# Patient Record
Sex: Female | Born: 1990
Health system: Southern US, Community
[De-identification: ages and names within clinical notes are randomized; demographics above are authoritative.]

## PROBLEM LIST (undated history)

## (undated) DIAGNOSIS — J45909 Unspecified asthma, uncomplicated: Secondary | ICD-10-CM

## (undated) DIAGNOSIS — K219 Gastro-esophageal reflux disease without esophagitis: Secondary | ICD-10-CM

## (undated) DIAGNOSIS — T7840XA Allergy, unspecified, initial encounter: Secondary | ICD-10-CM

## (undated) DIAGNOSIS — F419 Anxiety disorder, unspecified: Secondary | ICD-10-CM

## (undated) DIAGNOSIS — Z9889 Other specified postprocedural states: Secondary | ICD-10-CM

## (undated) HISTORY — DX: Allergy, unspecified, initial encounter: T78.40XA

## (undated) HISTORY — DX: Gastro-esophageal reflux disease without esophagitis: K21.9

---

## 2016-10-07 HISTORY — PX: FOOT SURGERY: SHX648

## 2018-10-07 DIAGNOSIS — Z87442 Personal history of urinary calculi: Secondary | ICD-10-CM

## 2018-10-07 HISTORY — DX: Personal history of urinary calculi: Z87.442

## 2019-02-28 ENCOUNTER — Emergency Department (HOSPITAL_COMMUNITY): Payer: No Typology Code available for payment source

## 2019-02-28 ENCOUNTER — Other Ambulatory Visit: Payer: Self-pay

## 2019-02-28 ENCOUNTER — Emergency Department (HOSPITAL_COMMUNITY)
Admission: EM | Admit: 2019-02-28 | Discharge: 2019-02-28 | Disposition: A | Discharge status: Home / Self Care | Payer: No Typology Code available for payment source | Attending: Emergency Medicine | Admitting: Emergency Medicine

## 2019-02-28 ENCOUNTER — Encounter (HOSPITAL_COMMUNITY): Payer: Self-pay

## 2019-02-28 DIAGNOSIS — J45909 Unspecified asthma, uncomplicated: Secondary | ICD-10-CM | POA: Insufficient documentation

## 2019-02-28 DIAGNOSIS — R102 Pelvic and perineal pain: Secondary | ICD-10-CM | POA: Diagnosis present

## 2019-02-28 DIAGNOSIS — R52 Pain, unspecified: Secondary | ICD-10-CM

## 2019-02-28 DIAGNOSIS — Z79899 Other long term (current) drug therapy: Secondary | ICD-10-CM | POA: Insufficient documentation

## 2019-02-28 DIAGNOSIS — N201 Calculus of ureter: Secondary | ICD-10-CM | POA: Insufficient documentation

## 2019-02-28 HISTORY — DX: Unspecified asthma, uncomplicated: J45.909

## 2019-02-28 HISTORY — DX: Other specified postprocedural states: Z98.890

## 2019-02-28 HISTORY — DX: Anxiety disorder, unspecified: F41.9

## 2019-02-28 LAB — COMPREHENSIVE METABOLIC PANEL
ALT: 29 U/L (ref 0–44)
AST: 18 U/L (ref 15–41)
Albumin: 4.4 g/dL (ref 3.5–5.0)
Alkaline Phosphatase: 53 U/L (ref 38–126)
Anion gap: 6 (ref 5–15)
BUN: 11 mg/dL (ref 6–20)
CO2: 29 mmol/L (ref 22–32)
Calcium: 9.2 mg/dL (ref 8.9–10.3)
Chloride: 107 mmol/L (ref 98–111)
Creatinine, Ser: 0.8 mg/dL (ref 0.44–1.00)
GFR calc Af Amer: >60 mL/min (ref >60)
GFR calc non Af Amer: >60 mL/min (ref >60)
Glucose, Bld: 97 mg/dL (ref 70–99)
Potassium: 3.5 mmol/L (ref 3.5–5.1)
Sodium: 142 mmol/L (ref 135–145)
Total Bilirubin: 0.3 mg/dL (ref 0.3–1.2)
Total Protein: 7.5 g/dL (ref 6.5–8.1)

## 2019-02-28 LAB — URINALYSIS, ROUTINE W REFLEX MICROSCOPIC
Bilirubin Urine: NEGATIVE
Glucose, UA: NEGATIVE mg/dL
Ketones, ur: NEGATIVE mg/dL
Leukocytes,Ua: NEGATIVE
Nitrite: NEGATIVE
Protein, ur: NEGATIVE mg/dL
RBC / HPF: >50 RBC/hpf — ABNORMAL HIGH (ref 0–5)
Specific Gravity, Urine: 1.018 (ref 1.005–1.030)
pH: 9 — ABNORMAL HIGH (ref 5.0–8.0)

## 2019-02-28 LAB — CBC WITH DIFFERENTIAL/PLATELET
Abs Immature Granulocytes: 0.05 10*3/uL (ref 0.00–0.07)
Basophils Absolute: 0.1 10*3/uL (ref 0.0–0.1)
Basophils Relative: 1 %
Eosinophils Absolute: 0.2 10*3/uL (ref 0.0–0.5)
Eosinophils Relative: 1 %
HCT: 40.6 % (ref 36.0–46.0)
Hemoglobin: 13.2 g/dL (ref 12.0–15.0)
Immature Granulocytes: 0 %
Lymphocytes Relative: 22 %
Lymphs Abs: 3.4 10*3/uL (ref 0.7–4.0)
MCH: 29.6 pg (ref 26.0–34.0)
MCHC: 32.5 g/dL (ref 30.0–36.0)
MCV: 91 fL (ref 80.0–100.0)
Monocytes Absolute: 1.1 10*3/uL — ABNORMAL HIGH (ref 0.1–1.0)
Monocytes Relative: 7 %
Neutro Abs: 10.4 10*3/uL — ABNORMAL HIGH (ref 1.7–7.7)
Neutrophils Relative %: 69 %
Platelets: 412 10*3/uL — ABNORMAL HIGH (ref 150–400)
RBC: 4.46 MIL/uL (ref 3.87–5.11)
RDW: 11.9 % (ref 11.5–15.5)
WBC: 15.3 10*3/uL — ABNORMAL HIGH (ref 4.0–10.5)
nRBC: 0 % (ref 0.0–0.2)

## 2019-02-28 LAB — LIPASE, BLOOD: Lipase: 34 U/L (ref 11–51)

## 2019-02-28 LAB — POC URINE PREG, ED: Preg Test, Ur: NEGATIVE

## 2019-02-28 LAB — WET PREP, GENITAL
Clue Cells Wet Prep HPF POC: NONE SEEN
Sperm: NONE SEEN
Trich, Wet Prep: NONE SEEN
Yeast Wet Prep HPF POC: NONE SEEN

## 2019-02-28 MED ORDER — ONDANSETRON HCL 4 MG PO TABS
4.0000 mg | ORAL_TABLET | Freq: Once | ORAL | Status: AC
  Administered 2019-02-28: 4 mg via ORAL
  Filled 2019-02-28: qty 1

## 2019-02-28 MED ORDER — IOHEXOL 300 MG/ML  SOLN
100.0000 mL | Freq: Once | INTRAMUSCULAR | Status: AC | PRN
  Administered 2019-02-28: 100 mL via INTRAVENOUS

## 2019-02-28 MED ORDER — SODIUM CHLORIDE (PF) 0.9 % IJ SOLN
INTRAMUSCULAR | Status: AC
  Filled 2019-02-28: qty 50

## 2019-02-28 MED ORDER — ONDANSETRON HCL 4 MG PO TABS: 4.0000 mg | ORAL_TABLET | Freq: Three times a day (TID) | ORAL | 0 refills | Status: DC | PRN

## 2019-02-28 MED ORDER — TAMSULOSIN HCL 0.4 MG PO CAPS: 0.4000 mg | ORAL_CAPSULE | Freq: Every day | ORAL | 0 refills | Status: AC

## 2019-02-28 MED ORDER — MORPHINE SULFATE (PF) 4 MG/ML IV SOLN
4.0000 mg | Freq: Once | INTRAVENOUS | Status: AC
  Administered 2019-02-28: 4 mg via INTRAVENOUS
  Filled 2019-02-28: qty 1

## 2019-02-28 MED ORDER — SODIUM CHLORIDE 0.9 % IV BOLUS
1000.0000 mL | Freq: Once | INTRAVENOUS | Status: AC
  Administered 2019-02-28: 17:00:00 1000 mL via INTRAVENOUS

## 2019-02-28 MED ORDER — HYDROCODONE-ACETAMINOPHEN 5-325 MG PO TABS: 1.0000 | ORAL_TABLET | Freq: Four times a day (QID) | ORAL | 0 refills | Status: DC | PRN

## 2019-02-28 MED ORDER — ONDANSETRON HCL 4 MG/2ML IJ SOLN
4.0000 mg | Freq: Once | INTRAMUSCULAR | Status: AC
  Administered 2019-02-28: 17:00:00 4 mg via INTRAVENOUS
  Filled 2019-02-28: qty 2

## 2019-02-28 MED ORDER — MORPHINE SULFATE (PF) 4 MG/ML IV SOLN
4.0000 mg | Freq: Once | INTRAVENOUS | Status: AC
  Administered 2019-02-28: 18:00:00 4 mg via INTRAVENOUS
  Filled 2019-02-28: qty 1

## 2019-02-28 NOTE — ED Triage Notes (Signed)
Pt states since this am she started having sharp pain in her "crotch". Pt states that she has an IUD that was supposed to get removed in January and is concerned that is causing her to be unable to void. Pt states that the last time she was able to void was at 0815, and has been unable to since.

## 2019-02-28 NOTE — Discharge Instructions (Addendum)
Today you were diagnosed with a kidney stone on your CT scan.  You will be given a prescription for Flomax, pain medication, and nausea medication.  You should not drive, work, or operate machinery while taking the pain medication as it can make you very drowsy.  You will need to follow-up with urology for reevaluation and for further treatment of your kidney stone.   You have been tested for chlamydia and gonorrhea.  These results will be available in approximately 3 days and you will be contacted by the hospital if the results are positive. Avoid sexual contact until you are aware of the results, and please inform all sexual partners if you test positive for any of these diseases.  You will need to return to the emergency department for any fevers, persistent pain, persistent vomiting, inability to urinate, or any new or worsening symptoms.

## 2019-02-28 NOTE — ED Provider Notes (Addendum)
[facility]-EMERGENCY DEPT Provider Note   CSN: [id number] Arrival date & time: 02/28/19  1606    History   Chief Complaint Chief Complaint  Patient presents with   Pelvic Pain    HPI Sara Mccormick is a 28 y.o. female.     HPI   Pt is a 28 y/o female with a h/o anxiety, asthma, who presents to the ED today c/o right lower abd pain/pelvic pain that began this morning. Pain is constant and severe in nature. Rates it "20"/10. States pain radiates through to her back.  Reports possible hematuria this morning. No dysuria. States she feels like she has had some urinary retention today.  Reports nausea, vomiting as well. Denies fevers, diarrhea, constipation.  Denies vaginal bleeding or vaginal discharge. She is currently sexually active and states she uses protection every time.   Past Medical History:  Diagnosis Date   Anxiety disorder    Asthma    H/O colonoscopy     There are no active problems to display for this patient.   Past Surgical History:  Procedure Laterality Date   FOOT SURGERY Right      OB History   No obstetric history on file.      Home Medications    Prior to Admission medications   Medication Sig Start Date End Date Taking? Authorizing Provider  Ascorbic Acid (VITAMIN C PO) Take 1 tablet by mouth daily.   Yes [provider]  Multiple Vitamins-Minerals (MULTIVITAMIN GUMMIES ADULT PO) Take 1 each by mouth daily.   Yes [provider]  VITAMIN D PO Take 1 tablet by mouth daily.   Yes [provider]  VITAMIN E PO Take 1 tablet by mouth daily.   Yes [provider]  HYDROcodone-acetaminophen (NORCO/VICODIN) 5-325 MG tablet Take 1 tablet by mouth every 6 (six) hours as needed. 02/28/19   Jeremih Dearmas S, PA-C  ondansetron (ZOFRAN) 4 MG tablet Take 1 tablet (4 mg total) by mouth every 8 (eight) hours as needed for nausea or vomiting. 02/28/19   Vernee Baines S, PA-C  tamsulosin  (FLOMAX) 0.4 MG CAPS capsule Take 1 capsule (0.4 mg total) by mouth daily for 30 days. 02/28/19 03/30/19  Loyalty Arentz S, PA-C    Family History No family history on file.  Social History Social History   Tobacco Use   Smoking status: Never Smoker   Smokeless tobacco: Never Used  Substance Use Topics   Alcohol use: Never    Frequency: Never   Drug use: Never     Allergies   Norco [hydrocodone-acetaminophen]   Review of Systems Review of Systems  Constitutional: Negative for fever.  HENT: Negative for sore throat.   Eyes: Negative for visual disturbance.  Respiratory: Negative for cough and shortness of breath.   Cardiovascular: Negative for chest pain.  Gastrointestinal: Positive for abdominal pain, nausea and vomiting. Negative for constipation and diarrhea.  Genitourinary: Positive for difficulty urinating, hematuria (?) and pelvic pain. Negative for dysuria, vaginal bleeding and vaginal discharge.  Musculoskeletal: Negative for arthralgias and back pain.  Skin: Negative for color change and rash.  Neurological: Negative for headaches.  All other systems reviewed and are negative.    Physical Exam Updated Vital Signs BP 139/80 (BP Location: Right Arm)    Pulse 84    Temp 98.3 F (36.8 C) (Oral)    Resp 18    Ht  (1.778 m)    Wt 93 kg    SpO2  96%    BMI 29.41 kg/m   Physical Exam Vitals signs and nursing note reviewed.  Constitutional:      General: She is in acute distress.     Appearance: She is well-developed.  HENT:     Head: Normocephalic and atraumatic.  Eyes:     Conjunctiva/sclera: Conjunctivae normal.  Neck:     Musculoskeletal: Neck supple.  Cardiovascular:     Rate and Rhythm: Regular rhythm. Tachycardia present.     Heart sounds: Normal heart sounds. No murmur.  Pulmonary:     Effort: Pulmonary effort is normal. No respiratory distress.     Breath sounds: Normal breath sounds. No wheezing, rhonchi or rales.  Abdominal:      General: Bowel sounds are normal. There is no distension.     Palpations: Abdomen is soft.     Tenderness: There is abdominal tenderness (rlq/right suprapubic area). There is right CVA tenderness and guarding. There is no left CVA tenderness.  Genitourinary:    Comments: Exam performed by Karrie Meres,  exam chaperoned Date: 02/28/2019 Pelvic exam: normal external genitalia without evidence of trauma. VULVA: normal appearing vulva with no masses, tenderness or lesion. VAGINA: normal appearing vagina with normal color and discharge, no lesions. CERVIX: small amount of erythema to cervix, IUD strings visualized, cervical motion tenderness absent, cervical os closed, thick clear discharge mixed with yellow discharge present;  Wet prep and DNA probe for chlamydia and GC obtained.   ADNEXA: normal adnexa in size, right adnexal ttp present, no left adnexal ttp UTERUS: uterus is normal size, shape, consistency and nontender.  Skin:    General: Skin is warm and dry.  Neurological:     Mental Status: She is alert.      ED Treatments / Results  Labs (all labs ordered are listed, but only abnormal results are displayed) Labs Reviewed  WET PREP, GENITAL - Abnormal; Notable for the following components:      Result Value   WBC, Wet Prep HPF POC FEW (*)    All other components within normal limits  URINALYSIS, ROUTINE W REFLEX MICROSCOPIC - Abnormal; Notable for the following components:   APPearance TURBID (*)    pH 9.0 (*)    Hgb urine dipstick LARGE (*)    RBC / HPF >50 (*)    Bacteria, UA RARE (*)    All other components within normal limits  CBC WITH DIFFERENTIAL/PLATELET - Abnormal; Notable for the following components:   WBC 15.3 (*)    Platelets 412 (*)    Neutro Abs 10.4 (*)    Monocytes Absolute 1.1 (*)    All other components within normal limits  COMPREHENSIVE METABOLIC PANEL  LIPASE, BLOOD  POC URINE PREG, ED  GC/CHLAMYDIA PROBE AMP ([place]) NOT AT [facility]     EKG None  Radiology US Pelvis Complete  Result Date: 02/28/2019 CLINICAL DATA:  Right lower quadrant pain EXAM: TRANSABDOMINAL AND TRANSVAGINAL ULTRASOUND OF PELVIS DOPPLER ULTRASOUND OF OVARIES TECHNIQUE: Both transabdominal and transvaginal ultrasound examinations of the pelvis were performed. Transabdominal technique was performed for global imaging of the pelvis including uterus, ovaries, adnexal regions, and pelvic cul-de-sac. It was necessary to proceed with endovaginal exam following the transabdominal exam to visualize the endometrium and ovaries. Color and duplex Doppler ultrasound was utilized to evaluate blood flow to the ovaries. COMPARISON:  None. FINDINGS: Uterus Measurements: 7.9 x 3.7 x 4.5 cm = volume: 68 mL. Intrauterine device in satisfactory position. No fibroids or other mass visualized.  Endometrium Thickness: 3 mm. No focal abnormality visualized. Small amount of fluid in the endometrial canal. Right ovary Measurements: 3 x 2 x 1.6 cm = volume: 4.9 mL. Normal appearance/no adnexal mass. Left ovary Measurements: 2 x 1.5 x 1.4 cm = volume: 2.2 mL. Normal appearance/no adnexal mass. Pulsed Doppler evaluation of both ovaries demonstrates normal low-resistance arterial and venous waveforms. Other findings No abnormal free fluid. IMPRESSION: 1. Normal pelvic ultrasound. 2. Intrauterine device in satisfactory position. 3. No ovarian torsion. Electronically Signed   By: Elige Ko   On: 02/28/2019 19:44   Ct Abdomen Pelvis W Contrast  Result Date: 02/28/2019 CLINICAL DATA:  Right lower quadrant and suprapubic pain starting this morning. EXAM: CT ABDOMEN AND PELVIS WITH CONTRAST TECHNIQUE: Multidetector CT imaging of the abdomen and pelvis was performed using the standard protocol following bolus administration of intravenous contrast. CONTRAST:  OMNIPAQUE IOHEXOL 300 MG/ML  SOLN COMPARISON:  None. FINDINGS: Lower chest: Hepatobiliary: No focal liver abnormality is seen. No  gallstones, gallbladder wall thickening, or biliary dilatation. Pancreas: Unremarkable. No pancreatic ductal dilatation or surrounding inflammatory changes. Spleen: Normal in size without focal abnormality. Adrenals/Urinary Tract: The left kidney and left adrenal gland are unremarkable. There is fat stranding about the right kidney. There is mild right-sided hydroureteronephrosis secondary to an obstructing 1-2 mm stone at the right UVJ. No additional radiopaque stones identified in the right kidney. The bladder is unremarkable. Stomach/Bowel: Stomach is within normal limits. Appendix appears normal. No evidence of bowel wall thickening, distention, or inflammatory changes. Vascular/Lymphatic: No significant vascular findings are present. No enlarged abdominal or pelvic lymph nodes. Reproductive: An IUD is in place. The ovaries are unremarkable. There is trace amount of free fluid in the pelvis, likely physiologic. Other: No abdominal wall hernia or abnormality. No abdominopelvic ascites. Musculoskeletal: No acute or significant osseous findings. IMPRESSION: 1. Mild right-sided hydroureteronephrosis secondary to an obstructing 1-2 mm stone at the right UVJ. No additional radiopaque stones identified in either kidney. 2. Normal appendix in the right lower quadrant. 3. IUD in place that appears grossly well positioned. 4. Trace amount of free fluid in the pelvis, likely reactive or physiologic. Electronically Signed   By: Katherine Mantle M.D.   On: 02/28/2019 20:40   [place] Art/ven Flow Abd Pelv Doppler  Result Date: 02/28/2019 CLINICAL DATA:  Right lower quadrant pain EXAM: TRANSABDOMINAL AND TRANSVAGINAL ULTRASOUND OF PELVIS DOPPLER ULTRASOUND OF OVARIES TECHNIQUE: Both transabdominal and transvaginal ultrasound examinations of the pelvis were performed. Transabdominal technique was performed for global imaging of the pelvis including uterus, ovaries, adnexal regions, and pelvic cul-de-sac. It was necessary to  proceed with endovaginal exam following the transabdominal exam to visualize the endometrium and ovaries. Color and duplex Doppler ultrasound was utilized to evaluate blood flow to the ovaries. COMPARISON:  None. FINDINGS: Uterus Measurements: 7.9 x 3.7 x 4.5 cm = volume: 68 mL. Intrauterine device in satisfactory position. No fibroids or other mass visualized. Endometrium Thickness: 3 mm. No focal abnormality visualized. Small amount of fluid in the endometrial canal. Right ovary Measurements: 3 x 2 x 1.6 cm = volume: 4.9 mL. Normal appearance/no adnexal mass. Left ovary Measurements: 2 x 1.5 x 1.4 cm = volume: 2.2 mL. Normal appearance/no adnexal mass. Pulsed Doppler evaluation of both ovaries demonstrates normal low-resistance arterial and venous waveforms. Other findings No abnormal free fluid. IMPRESSION: 1. Normal pelvic ultrasound. 2. Intrauterine device in satisfactory position. 3. No ovarian torsion. Electronically Signed   By: Elige Ko   On: 02/28/2019  19:44   [place] Pelvic Complete W Transvaginal And Torsion R/o  Result Date: 02/28/2019 CLINICAL DATA:  Right lower quadrant pain EXAM: TRANSABDOMINAL AND TRANSVAGINAL ULTRASOUND OF PELVIS DOPPLER ULTRASOUND OF OVARIES TECHNIQUE: Both transabdominal and transvaginal ultrasound examinations of the pelvis were performed. Transabdominal technique was performed for global imaging of the pelvis including uterus, ovaries, adnexal regions, and pelvic cul-de-sac. It was necessary to proceed with endovaginal exam following the transabdominal exam to visualize the endometrium and ovaries. Color and duplex Doppler ultrasound was utilized to evaluate blood flow to the ovaries. COMPARISON:  None. FINDINGS: Uterus Measurements: 7.9 x 3.7 x 4.5 cm = volume: 68 mL. Intrauterine device in satisfactory position. No fibroids or other mass visualized. Endometrium Thickness: 3 mm. No focal abnormality visualized. Small amount of fluid in the endometrial canal. Right ovary  Measurements: 3 x 2 x 1.6 cm = volume: 4.9 mL. Normal appearance/no adnexal mass. Left ovary Measurements: 2 x 1.5 x 1.4 cm = volume: 2.2 mL. Normal appearance/no adnexal mass. Pulsed Doppler evaluation of both ovaries demonstrates normal low-resistance arterial and venous waveforms. Other findings No abnormal free fluid. IMPRESSION: 1. Normal pelvic ultrasound. 2. Intrauterine device in satisfactory position. 3. No ovarian torsion. Electronically Signed   By: Elige KoHetal  Patel   On: 02/28/2019 19:44    Procedures Procedures (including critical care time)  Medications Ordered in ED Medications  sodium chloride (PF) 0.9 % injection (has no administration in time range)  ondansetron (ZOFRAN) tablet 4 mg (4 mg Oral Given 02/28/19 1636)  ondansetron (ZOFRAN) injection 4 mg (4 mg Intravenous Given 02/28/19 1707)  sodium chloride 0.9 % bolus 1,000 mL (0 mLs Intravenous Stopped 02/28/19 1757)  morphine 4 MG/ML injection 4 mg (4 mg Intravenous Given 02/28/19 1707)  morphine 4 MG/ML injection 4 mg (4 mg Intravenous Given 02/28/19 1757)  iohexol (OMNIPAQUE) 300 MG/ML solution 100 mL (100 mLs Intravenous Contrast Given 02/28/19 2024)     Initial Impression / Assessment and Plan / ED Course  I have reviewed the triage vital signs and the nursing notes.  Pertinent labs & imaging results that were available during my care of the patient were reviewed by me and considered in my medical decision making (see chart for details).     Final Clinical Impressions(s) / ED Diagnoses   Final diagnoses:  Ureteral stone   Pt with rlq/right suprapubic abd pain starting this AM. Associated with NV. No fevers. Mildly tachycardic. Hypertensive. Afebrile.  On exam has RLQ TTP and right suprapubic ttp with guarding. Also with right CVA TTP. Pelvic exam with right adnexal TTP. No CMT. Mildly erythematous cervix, IUD strings visualized.   CBC with leukocytosis of 15. No anemia. Thrombocytosis present which could be reactive.   CMP WNL Lipase WNL UA with hematuria, and >50 RBCs. Rare bacteria. No leukocytes or nitrites. 0-5 WBCs.  U preg negative  Pelvic US ordered due to concern for ovarian torsion which showed no evidence of ovarian torsion or other abnormality.  CT abd/pelvis added to assess for possible appendicitis, kidney stone, or other abnormality which showed: 1. Mild right-sided hydroureteronephrosis secondary to an obstructing 1-2 mm stone at the right UVJ. No additional radiopaque stones identified in either kidney. 2. Normal appendix in the right lower quadrant. 3. IUD in place that appears grossly well positioned. 4. Trace amount of free fluid in the pelvis, likely reactive or Physiologic.  On reassessment after pain medicines and nausea medicines patient symptoms feel improved.  She has been able to tolerate p.o.  I discussed the findings of ureteral stone on her CT scan.  Discussed the remainder of the findings.  Advised patient will need to follow-up with urology.  She will be given Rx for pain medication, nausea medicine and Flomax.  [facility] narcotic database reviewed and there are no red flags.  Advised return to the ER for new or worsening symptoms in the meantime.  She voiced understanding of the plan agrees to return.  Questions answered.  Patient stable at discharge.    ED Discharge Orders         Ordered    tamsulosin (FLOMAX) 0.4 MG CAPS capsule  Daily     02/28/19 2050    HYDROcodone-acetaminophen (NORCO/VICODIN) 5-325 MG tablet  Every 6 hours PRN     02/28/19 2050    ondansetron (ZOFRAN) 4 MG tablet  Every 8 hours PRN     02/28/19 2050           Ruqayya Ventress S, PA-C 02/28/19 2052    Nakyah Erdmann S, PA-C 02/28/19 2054    Shaune Pollack, MD 03/01/19 1031

## 2019-02-28 NOTE — ED Notes (Signed)
Attempted to bladder scan pt. Bladder scanner machine was not working.

## 2019-03-02 LAB — GC/CHLAMYDIA PROBE AMP (~~LOC~~) NOT AT ARMC
Chlamydia: NEGATIVE
Neisseria Gonorrhea: NEGATIVE

## 2019-08-04 ENCOUNTER — Other Ambulatory Visit: Payer: Self-pay

## 2019-08-04 DIAGNOSIS — Z20822 Contact with and (suspected) exposure to covid-19: Secondary | ICD-10-CM

## 2019-08-05 LAB — NOVEL CORONAVIRUS, NAA: SARS-CoV-2, NAA: DETECTED — AB

## 2020-02-09 LAB — HM PAP SMEAR: HM Pap smear: NEGATIVE

## 2020-04-20 ENCOUNTER — Ambulatory Visit (INDEPENDENT_AMBULATORY_CARE_PROVIDER_SITE_OTHER)
Admission: RE | Admit: 2020-04-20 | Discharge: 2020-04-20 | Disposition: A | Discharge status: Home / Self Care | Payer: No Typology Code available for payment source | Source: Ambulatory Visit | Attending: Physician Assistant | Admitting: Physician Assistant

## 2020-04-20 ENCOUNTER — Encounter: Payer: Self-pay | Admitting: Physician Assistant

## 2020-04-20 ENCOUNTER — Ambulatory Visit (INDEPENDENT_AMBULATORY_CARE_PROVIDER_SITE_OTHER): Payer: No Typology Code available for payment source | Admitting: Physician Assistant

## 2020-04-20 ENCOUNTER — Other Ambulatory Visit: Payer: Self-pay

## 2020-04-20 VITALS — BP 110/80 | HR 107 | Temp 98.5°F | Resp 16 | Ht 70.0 in | Wt 219.0 lb

## 2020-04-20 DIAGNOSIS — M40202 Unspecified kyphosis, cervical region: Secondary | ICD-10-CM

## 2020-04-20 DIAGNOSIS — G8929 Other chronic pain: Secondary | ICD-10-CM | POA: Diagnosis not present

## 2020-04-20 DIAGNOSIS — M5441 Lumbago with sciatica, right side: Secondary | ICD-10-CM

## 2020-04-20 MED ORDER — MELOXICAM 15 MG PO TABS: 15.0000 mg | ORAL_TABLET | Freq: Every day | ORAL | 0 refills | Status: DC

## 2020-04-20 NOTE — Patient Instructions (Signed)
Please go to our [place] office for x-ray. You can do this M-F 8-5.  The address is [address] [place], [place]. (Across from [facility]) The ladies can give you directions if needed.  Avoid heavy lifting or overexertion. Apply heating pad to the lower back for 10-15 minutes a few times per day. Take the meloxicam as directed with food. Restart the muscle relaxant in the evening. Start the stretches below. Let me know if things are not resolving.  When you are able, let's get you in for a checkup with fasting labs. This can be done at your earliest convenience.   It was very nice meeting you today. Welcome to [facility]!   Sciatica Rehab Ask your health care provider which exercises are safe for you. Do exercises exactly as told by your health care provider and adjust them as directed. It is normal to feel mild stretching, pulling, tightness, or discomfort as you do these exercises. Stop right away if you feel sudden pain or your pain gets worse. Do not begin these exercises until told by your health care provider. Stretching and range-of-motion exercises These exercises warm up your muscles and joints and improve the movement and flexibility of your hips and back. These exercises also help to relieve pain, numbness, and tingling. Sciatic nerve glide 1. Sit in a chair with your head facing down toward your chest. Place your hands behind your back. Let your shoulders slump forward. 2. Slowly straighten one of your legs while you tilt your head back as if you are looking toward the ceiling. Only straighten your leg as far as you can without making your symptoms worse. 3. Hold this position for __________ seconds. 4. Slowly return to the starting position. 5. Repeat with your other leg. Repeat __________ times. Complete this exercise __________ times a day. Knee to chest with hip adduction and internal rotation  1. Lie on your back on a firm surface with both legs  straight. 2. Bend one of your knees and move it up toward your chest until you feel a gentle stretch in your lower back and buttock. Then, move your knee toward the shoulder that is on the opposite side from your leg. This is hip adduction and internal rotation. ? Hold your leg in this position by holding on to the front of your knee. 3. Hold this position for __________ seconds. 4. Slowly return to the starting position. 5. Repeat with your other leg. Repeat __________ times. Complete this exercise __________ times a day. Prone extension on elbows  1. Lie on your abdomen on a firm surface. A bed may be too soft for this exercise. 2. Prop yourself up on your elbows. 3. Use your arms to help lift your chest up until you feel a gentle stretch in your abdomen and your lower back. ? This will place some of your body weight on your elbows. If this is uncomfortable, try stacking pillows under your chest. ? Your hips should stay down, against the surface that you are lying on. Keep your hip and back muscles relaxed. 4. Hold this position for __________ seconds. 5. Slowly relax your upper body and return to the starting position. Repeat __________ times. Complete this exercise __________ times a day. Strengthening exercises These exercises build strength and endurance in your back. Endurance is the ability to use your muscles for a long time, even after they get tired. Pelvic tilt This exercise strengthens the muscles that lie deep in the abdomen. 1.  Lie on your back on a firm surface. Bend your knees and keep your feet flat on the floor. 2. Tense your abdominal muscles. Tip your pelvis up toward the ceiling and flatten your lower back into the floor. ? To help with this exercise, you may place a small towel under your lower back and try to push your back into the towel. 3. Hold this position for __________ seconds. 4. Let your muscles relax completely before you repeat this exercise. Repeat  __________ times. Complete this exercise __________ times a day. Alternating arm and leg raises  1. Get on your hands and knees on a firm surface. If you are on a hard floor, you may want to use padding, such as an exercise mat, to cushion your knees. 2. Line up your arms and legs. Your hands should be directly below your shoulders, and your knees should be directly below your hips. 3. Lift your left leg behind you. At the same time, raise your right arm and straighten it in front of you. ? Do not lift your leg higher than your hip. ? Do not lift your arm higher than your shoulder. ? Keep your abdominal and back muscles tight. ? Keep your hips facing the ground. ? Do not arch your back. ? Keep your balance carefully, and do not hold your breath. 4. Hold this position for __________ seconds. 5. Slowly return to the starting position. 6. Repeat with your right leg and your left arm. Repeat __________ times. Complete this exercise __________ times a day. Posture and body mechanics Good posture and healthy body mechanics can help to relieve stress in your body's tissues and joints. Body mechanics refers to the movements and positions of your body while you do your daily activities. Posture is part of body mechanics. Good posture means:  Your spine is in its natural S-curve position (neutral).  Your shoulders are pulled back slightly.  Your head is not tipped forward. Follow these guidelines to improve your posture and body mechanics in your everyday activities. Standing   When standing, keep your spine neutral and your feet about hip width apart. Keep a slight bend in your knees. Your ears, shoulders, and hips should line up.  When you do a task in which you stand in one place for a long time, place one foot up on a stable object that is 2-4 inches (5-10 cm) high, such as a footstool. This helps keep your spine neutral. Sitting   When sitting, keep your spine neutral and keep your feet  flat on the floor. Use a footrest, if necessary, and keep your thighs parallel to the floor. Avoid rounding your shoulders, and avoid tilting your head forward.  When working at a desk or a computer, keep your desk at a height where your hands are slightly lower than your elbows. Slide your chair under your desk so you are close enough to maintain good posture.  When working at a computer, place your monitor at a height where you are looking straight ahead and you do not have to tilt your head forward or downward to look at the screen. Resting  When lying down and resting, avoid positions that are most painful for you.  If you have pain with activities such as sitting, bending, stooping, or squatting, lie in a position in which your body does not bend very much. For example, avoid curling up on your side with your arms and knees near your chest (fetal position).  If you  have pain with activities such as standing for a long time or reaching with your arms, lie with your spine in a neutral position and bend your knees slightly. Try the following positions: ? Lying on your side with a pillow between your knees. ? Lying on your back with a pillow under your knees. Lifting   When lifting objects, keep your feet at least shoulder width apart and tighten your abdominal muscles.  Bend your knees and hips and keep your spine neutral. It is important to lift using the strength of your legs, not your back. Do not lock your knees straight out.  Always ask for help to lift heavy or awkward objects. This information is not intended to replace advice given to you by your health care provider. Make sure you discuss any questions you have with your health care provider. Document Revised: 01/15/2019 Document Reviewed: 10/15/2018 Elsevier Patient Education  2020 [employer].

## 2020-04-20 NOTE — Progress Notes (Signed)
Patient presents to clinic today to establish care. Moved to the area from [place] just before the COVID pandemic started. Has been able to adjust well to the area.  Patient with prior history of asthmatic bronchitis, requiring periodic use of a rescue inhaler. Notes since moving to [place] she has not had any issue with her breathing. Denies history of nighttime symptoms or exacerbations requiring steroids..  Patient endorses about 1 month of lower back pain of unknown trigger. Was evaluated at an UC 2-3 weeks ago at which time she was told she had sciatica, and given pain medication a prednisone taper and an in-office injection.  Notes improvement in her symptoms but over the past week, they are back to baseline. Denies any known trauma or injury to the area prior to symptom onset. Denies numbness, tingling or weakness of extremities. Denies saddle anesthesia or change to bowel/bladder habits.   Past Medical History:  Diagnosis Date  . Anxiety disorder   . Asthma   . H/O colonoscopy     Current Outpatient Medications on File Prior to Visit  Medication Sig Dispense Refill  . Ascorbic Acid (VITAMIN C PO) Take 1 tablet by mouth daily.    Marland Kitchen HYDROcodone-acetaminophen (NORCO/VICODIN) 5-325 MG tablet Take 1 tablet by mouth every 6 (six) hours as needed. 8 tablet 0  . Multiple Vitamins-Minerals (MULTIVITAMIN GUMMIES ADULT PO) Take 1 each by mouth daily.    . ondansetron (ZOFRAN) 4 MG tablet Take 1 tablet (4 mg total) by mouth every 8 (eight) hours as needed for nausea or vomiting. 10 tablet 0  . VITAMIN D PO Take 1 tablet by mouth daily.    Marland Kitchen VITAMIN E PO Take 1 tablet by mouth daily.     No current facility-administered medications on file prior to visit.    Allergies  Allergen Reactions  . Norco [Hydrocodone-Acetaminophen] Nausea And Vomiting    No family history on file.  Social History   Socioeconomic History  . Marital status: Married    Spouse name: Not on file  . Number of  children: Not on file  . Years of education: Not on file  . Highest education level: Not on file  Occupational History  . Not on file  Tobacco Use  . Smoking status: Never Smoker  . Smokeless tobacco: Never Used  Substance and Sexual Activity  . Alcohol use: Never  . Drug use: Never  . Sexual activity: Not on file  Other Topics Concern  . Not on file  Social History Narrative  . Not on file   Social Determinants of Health   Financial Resource Strain:   . Difficulty of Paying Living Expenses:   Food Insecurity:   . Worried About Programme researcher, broadcasting/film/video in the Last Year:   . Barista in the Last Year:   Transportation Needs:   . Freight forwarder (Medical):   Marland Kitchen Lack of Transportation (Non-Medical):   Physical Activity:   . Days of Exercise per Week:   . Minutes of Exercise per Session:   Stress:   . Feeling of Stress :   Social Connections:   . Frequency of Communication with Friends and Family:   . Frequency of Social Gatherings with Friends and Family:   . Attends Religious Services:   . Active Member of Clubs or Organizations:   . Attends Banker Meetings:   Marland Kitchen Marital Status:    Review of Systems - See HPI.  All other ROS are  negative.  Ht 5\' 10"  (1.778 m)   Wt 219 lb (99.3 kg)   BMI 31.42 kg/m   Physical Exam Vitals reviewed.  Constitutional:      Appearance: Normal appearance.  HENT:     Head: Normocephalic and atraumatic.  Eyes:     Conjunctiva/sclera: Conjunctivae normal.  Cardiovascular:     Rate and Rhythm: Normal rate and regular rhythm.     Pulses: Normal pulses.     Heart sounds: Normal heart sounds.  Pulmonary:     Effort: Pulmonary effort is normal.     Breath sounds: Normal breath sounds.  Musculoskeletal:     Cervical back: Neck supple.     Thoracic back: Normal.     Lumbar back: Spasms and tenderness present. No bony tenderness. Normal range of motion.     Comments: Kyphosis of cervical spine noted on examination    Neurological:     General: No focal deficit present.     Mental Status: She is alert and oriented to person, place, and time.  Psychiatric:        Mood and Affect: Mood normal.      Assessment/Plan: 1. Chronic right-sided low back pain with right-sided sciatica Giving chronicity would check x-ray today to assess joints and disc space heights. Will have her continue muscle relaxant. Rx meloxicam 15 mg once daily.  Stretching exercises reviewed with patient. If not improving would proceed with physical therapy. May have to consider further imaging with MRI.  - DG Lumbar Spine Complete; Future  2. Kyphosis of cervical region, unspecified kyphosis type Patient would like x-ray to further assess. May need spine specialist in the future. No chronic neck pain at present thankfully. - DG Cervical Spine Complete; Future  This visit occurred during the SARS-CoV-2 public health emergency.  Safety protocols were in place, including screening questions prior to the visit, additional usage of staff PPE, and extensive cleaning of exam room while observing appropriate contact time as indicated for disinfecting solutions.      , PA-C

## 2020-04-28 ENCOUNTER — Encounter: Payer: 59 | Admitting: Physician Assistant

## 2020-05-11 ENCOUNTER — Encounter: Payer: Self-pay | Admitting: Physician Assistant

## 2020-05-11 ENCOUNTER — Ambulatory Visit (INDEPENDENT_AMBULATORY_CARE_PROVIDER_SITE_OTHER): Payer: No Typology Code available for payment source | Admitting: Physician Assistant

## 2020-05-11 ENCOUNTER — Other Ambulatory Visit: Payer: Self-pay

## 2020-05-11 VITALS — BP 110/80 | HR 101 | Temp 98.3°F | Resp 16 | Ht 70.0 in | Wt 220.0 lb

## 2020-05-11 DIAGNOSIS — Z1159 Encounter for screening for other viral diseases: Secondary | ICD-10-CM | POA: Diagnosis not present

## 2020-05-11 DIAGNOSIS — Z0000 Encounter for general adult medical examination without abnormal findings: Secondary | ICD-10-CM | POA: Diagnosis not present

## 2020-05-11 MED ORDER — FLUOCINOLONE ACETONIDE 0.01 % EX SOLN: Freq: Two times a day (BID) | CUTANEOUS | 0 refills | Status: AC

## 2020-05-11 NOTE — Progress Notes (Signed)
Patient presents to clinic today for annual exam.  Patient is fasting for labs. Patient has recently found out that she was 5-weeks pregnant. Has upcoming [place] scheduled.   Health Maintenance: Immunizations -- UTD. TDaP with OB/GYN  Past Medical History:  Diagnosis Date   Allergy    Anxiety disorder    Asthma    GERD (gastroesophageal reflux disease)    H/O colonoscopy    History of kidney stones 2020    Past Surgical History:  Procedure Laterality Date   FOOT SURGERY Right 2018    Current Outpatient Medications on File Prior to Visit  Medication Sig Dispense Refill   Prenatal Vit-DSS-Fe Fum-FA (PRENATAL 19) tablet Take 1 tablet by mouth daily.     No current facility-administered medications on file prior to visit.    Allergies  Allergen Reactions   Norco [Hydrocodone-Acetaminophen] Nausea And Vomiting    Family History  Problem Relation Age of Onset   Cancer Mother    Diabetes Mother    Hypertension Mother    Miscarriages / [place] Mother    Asperger's syndrome Father    Asperger's syndrome Brother    Scoliosis Maternal Grandmother    Alcohol abuse Maternal Grandfather    Kidney disease Maternal Grandfather    Cancer Paternal Grandmother    Heart attack Paternal Grandmother    Hypertension Paternal Grandmother    Cancer Paternal Grandfather    Hearing loss Paternal Grandfather    Asthma Sister    Depression Sister    Scoliosis Sister     Social History   Socioeconomic History   Marital status: Married    Spouse name: Not on file   Number of children: Not on file   Years of education: Not on file   Highest education level: Not on file  Occupational History   Not on file  Tobacco Use   Smoking status: Never Smoker   Smokeless tobacco: Never Used  Building services engineer Use: Never used  Substance and Sexual Activity   Alcohol use: Never   Drug use: Never   Sexual activity: Yes    Birth control/protection:  None  Other Topics Concern   Not on file  Social History Narrative   Not on file   Social Determinants of Health   Financial Resource Strain:    Difficulty of Paying Living Expenses:   Food Insecurity:    Worried About Programme researcher, broadcasting/film/video in the Last Year:    Barista in the Last Year:   Transportation Needs:    Freight forwarder (Medical):    Lack of Transportation (Non-Medical):   Physical Activity:    Days of Exercise per Week:    Minutes of Exercise per Session:   Stress:    Feeling of Stress :   Social Connections:    Frequency of Communication with Friends and Family:    Frequency of Social Gatherings with Friends and Family:    Attends Religious Services:    Active Member of Clubs or Organizations:    Attends Engineer, structural:    Marital Status:   Intimate Partner Violence:    Fear of Current or Ex-Partner:    Emotionally Abused:    Physically Abused:    Sexually Abused:    Review of Systems  Constitutional: Negative for fever and weight loss.  HENT: Negative for ear discharge, ear pain, hearing loss and tinnitus.   Eyes: Negative for blurred vision, double vision, photophobia and  pain.  Respiratory: Negative for cough and shortness of breath.   Cardiovascular: Negative for chest pain and palpitations.  Gastrointestinal: Negative for abdominal pain, blood in stool, constipation, diarrhea, heartburn, melena, nausea and vomiting.  Genitourinary: Negative for dysuria, flank pain, frequency, hematuria and urgency.  Musculoskeletal: Negative for falls.  Neurological: Negative for dizziness, loss of consciousness and headaches.  Endo/Heme/Allergies: Negative for environmental allergies.  Psychiatric/Behavioral: Negative for depression, hallucinations, substance abuse and suicidal ideas. The patient is not nervous/anxious and does not have insomnia.    BP 110/80    Pulse (!) 101    Temp 98.3 F (36.8 C) (Temporal)    Resp 16     Ht 5\' 10"  (1.778 m)    Wt 220 lb (99.8 kg)    LMP 04/05/2020    SpO2 99%    BMI 31.57 kg/m   Physical Exam Vitals reviewed.  HENT:     Head: Normocephalic and atraumatic.     Right Ear: Tympanic membrane, ear canal and external ear normal.     Left Ear: Tympanic membrane, ear canal and external ear normal.     Nose: Nose normal. No mucosal edema.     Mouth/Throat:     Pharynx: Uvula midline. No oropharyngeal exudate or posterior oropharyngeal erythema.  Eyes:     Conjunctiva/sclera: Conjunctivae normal.     Pupils: Pupils are equal, round, and reactive to light.  Neck:     Thyroid: No thyromegaly.  Cardiovascular:     Rate and Rhythm: Normal rate and regular rhythm.     Heart sounds: Normal heart sounds.  Pulmonary:     Effort: Pulmonary effort is normal. No respiratory distress.     Breath sounds: Normal breath sounds. No wheezing or rales.  Abdominal:     General: Bowel sounds are normal. There is no distension.     Palpations: Abdomen is soft. There is no mass.     Tenderness: There is no abdominal tenderness. There is no guarding or rebound.  Musculoskeletal:     Cervical back: Neck supple.  Lymphadenopathy:     Cervical: No cervical adenopathy.  Skin:    General: Skin is warm and dry.     Findings: No rash.  Neurological:     Mental Status: She is alert and oriented to person, place, and time.     Cranial Nerves: No cranial nerve deficit.    Assessment/Plan: 1. Visit for preventive health examination Depression screen negative. Health Maintenance reviewed. Preventive schedule discussed and handout given in AVS. Will obtain fasting labs today.  - CBC with Differential/Platelet - Comprehensive metabolic panel - Lipid panel - Hemoglobin A1c - TSH  2. Need for hepatitis C screening test - Hepatitis C Antibody  This visit occurred during the SARS-CoV-2 public health emergency.  Safety protocols were in place, including screening questions prior to the visit,  additional usage of staff PPE, and extensive cleaning of exam room while observing appropriate contact time as indicated for disinfecting solutions.    [id number]   04/07/2020, PA-C

## 2020-05-11 NOTE — Patient Instructions (Addendum)
Please go to the lab for blood work.   Our office will call you with your results unless you have chosen to receive results via MyChart.  If your blood work is normal we will follow-up each year for physicals and as scheduled for chronic medical problems.  If anything is abnormal we will treat accordingly and get you in for a follow-up.  Make sure to follow-up with the OB as scheduled.   Start the topical Synalar solution -- this is a lower potency topical steroid and safer for pregnancy.  Just use as needed.    Preventive Care 50-59 Years Old, Female Preventive care refers to visits with your health care provider and lifestyle choices that can promote health and wellness. This includes:  A yearly physical exam. This may also be called an annual well check.  Regular dental visits and eye exams.  Immunizations.  Screening for certain conditions.  Healthy lifestyle choices, such as eating a healthy diet, getting regular exercise, not using drugs or products that contain nicotine and tobacco, and limiting alcohol use. What can I expect for my preventive care visit? Physical exam Your health care provider will check your:  Height and weight. This may be used to calculate body mass index (BMI), which tells if you are at a healthy weight.  Heart rate and blood pressure.  Skin for abnormal spots. Counseling Your health care provider may ask you questions about your:  Alcohol, tobacco, and drug use.  Emotional well-being.  Home and relationship well-being.  Sexual activity.  Eating habits.  Work and work Statistician.  Method of birth control.  Menstrual cycle.  Pregnancy history. What immunizations do I need?  Influenza (flu) vaccine  This is recommended every year. Tetanus, diphtheria, and pertussis (Tdap) vaccine  You may need a Td booster every 10 years. Varicella (chickenpox) vaccine  You may need this if you have not been vaccinated. Human papillomavirus  (HPV) vaccine  If recommended by your health care provider, you may need three doses over 6 months. Measles, mumps, and rubella (MMR) vaccine  You may need at least one dose of MMR. You may also need a second dose. Meningococcal conjugate (MenACWY) vaccine  One dose is recommended if you are age 63-21 years and a first-year college student living in a residence hall, or if you have one of several medical conditions. You may also need additional booster doses. Pneumococcal conjugate (PCV13) vaccine  You may need this if you have certain conditions and were not previously vaccinated. Pneumococcal polysaccharide (PPSV23) vaccine  You may need one or two doses if you smoke cigarettes or if you have certain conditions. Hepatitis A vaccine  You may need this if you have certain conditions or if you travel or work in places where you may be exposed to hepatitis A. Hepatitis B vaccine  You may need this if you have certain conditions or if you travel or work in places where you may be exposed to hepatitis B. Haemophilus influenzae type b (Hib) vaccine  You may need this if you have certain conditions. You may receive vaccines as individual doses or as more than one vaccine together in one shot (combination vaccines). Talk with your health care provider about the risks and benefits of combination vaccines. What tests do I need?  Blood tests  Lipid and cholesterol levels. These may be checked every 5 years starting at age 76.  Hepatitis C test.  Hepatitis B test. Screening  Diabetes screening. This is done by  checking your blood sugar (glucose) after you have not eaten for a while (fasting).  Sexually transmitted disease (STD) testing.  BRCA-related cancer screening. This may be done if you have a family history of breast, ovarian, tubal, or peritoneal cancers.  Pelvic exam and Pap test. This may be done every 3 years starting at age 67. Starting at age 50, this may be done every 5  years if you have a Pap test in combination with an HPV test. Talk with your health care provider about your test results, treatment options, and if necessary, the need for more tests. Follow these instructions at home: Eating and drinking   Eat a diet that includes fresh fruits and vegetables, whole grains, lean protein, and low-fat dairy.  Take vitamin and mineral supplements as recommended by your health care provider.  Do not drink alcohol if: ? Your health care provider tells you not to drink. ? You are pregnant, may be pregnant, or are planning to become pregnant.  If you drink alcohol: ? Limit how much you have to 0-1 drink a day. ? Be aware of how much alcohol is in your drink. In the [place] one drink equals one 12 oz bottle of beer (355 mL), one 5 oz glass of wine (148 mL), or one 1 oz glass of hard liquor (44 mL). Lifestyle  Take daily care of your teeth and gums.  Stay active. Exercise for at least 30 minutes on 5 or more days each week.  Do not use any products that contain nicotine or tobacco, such as cigarettes, e-cigarettes, and chewing tobacco. If you need help quitting, ask your health care provider.  If you are sexually active, practice safe sex. Use a condom or other form of birth control (contraception) in order to prevent pregnancy and STIs (sexually transmitted infections). If you plan to become pregnant, see your health care provider for a preconception visit. What's next?  Visit your health care provider once a year for a well check visit.  Ask your health care provider how often you should have your eyes and teeth checked.  Stay up to date on all vaccines. This information is not intended to replace advice given to you by your health care provider. Make sure you discuss any questions you have with your health care provider. Document Revised: 06/04/2018 Document Reviewed: 06/04/2018 Elsevier Patient Education  [employer].   .

## 2020-05-12 ENCOUNTER — Ambulatory Visit: Payer: No Typology Code available for payment source

## 2020-05-12 LAB — CBC WITH DIFFERENTIAL/PLATELET
Basophils Absolute: 0.1 10*3/uL (ref 0.0–0.1)
Basophils Relative: 0.7 % (ref 0.0–3.0)
Eosinophils Absolute: 0.1 10*3/uL (ref 0.0–0.7)
Eosinophils Relative: 1 % (ref 0.0–5.0)
HCT: 36.9 % (ref 36.0–46.0)
Hemoglobin: 12.2 g/dL (ref 12.0–15.0)
Lymphocytes Relative: 24.2 % (ref 12.0–46.0)
Lymphs Abs: 2.4 10*3/uL (ref 0.7–4.0)
MCHC: 33.1 g/dL (ref 30.0–36.0)
MCV: 89.8 fl (ref 78.0–100.0)
Monocytes Absolute: 0.6 10*3/uL (ref 0.1–1.0)
Monocytes Relative: 5.7 % (ref 3.0–12.0)
Neutro Abs: 6.7 10*3/uL (ref 1.4–7.7)
Neutrophils Relative %: 68.4 % (ref 43.0–77.0)
Platelets: 413 10*3/uL — ABNORMAL HIGH (ref 150.0–400.0)
RBC: 4.1 Mil/uL (ref 3.87–5.11)
RDW: 13.6 % (ref 11.5–15.5)
WBC: 9.8 10*3/uL (ref 4.0–10.5)

## 2020-05-12 LAB — COMPREHENSIVE METABOLIC PANEL
ALT: 22 U/L (ref 0–35)
AST: 14 U/L (ref 0–37)
Albumin: 4.3 g/dL (ref 3.5–5.2)
Alkaline Phosphatase: 47 U/L (ref 39–117)
BUN: 5 mg/dL — ABNORMAL LOW (ref 6–23)
CO2: 25 mEq/L (ref 19–32)
Calcium: 9.3 mg/dL (ref 8.4–10.5)
Chloride: 102 mEq/L (ref 96–112)
Creatinine, Ser: 0.54 mg/dL (ref 0.40–1.20)
GFR: 133.67 mL/min (ref >60.00)
Glucose, Bld: 106 mg/dL — ABNORMAL HIGH (ref 70–99)
Potassium: 3.7 mEq/L (ref 3.5–5.1)
Sodium: 135 mEq/L (ref 135–145)
Total Bilirubin: 0.4 mg/dL (ref 0.2–1.2)
Total Protein: 6.7 g/dL (ref 6.0–8.3)

## 2020-05-12 LAB — LIPID PANEL
Cholesterol: 119 mg/dL (ref 0–200)
HDL: 41.7 mg/dL (ref >39.00)
LDL Cholesterol: 61 mg/dL (ref 0–99)
NonHDL: 77.27
Total CHOL/HDL Ratio: 3
Triglycerides: 79 mg/dL (ref 0.0–149.0)
VLDL: 15.8 mg/dL (ref 0.0–40.0)

## 2020-05-12 LAB — HEMOGLOBIN A1C: Hgb A1c MFr Bld: 5.5 % (ref 4.6–6.5)

## 2020-05-12 LAB — TSH: TSH: 1.4 u[IU]/mL (ref 0.35–4.50)

## 2020-05-15 LAB — HEPATITIS C ANTIBODY
Hepatitis C Ab: NONREACTIVE
SIGNAL TO CUT-OFF: 0.01 (ref <1.00)

## 2020-06-28 ENCOUNTER — Ambulatory Visit: Payer: Self-pay | Admitting: Internal Medicine

## 2021-05-09 IMAGING — DX DG CERVICAL SPINE COMPLETE 4+V
5 series · 5 of 5 positions shown · non-contrast
Comparison: None.

CLINICAL DATA: Neck pain, cervical kyphosis

EXAM:
CERVICAL SPINE - COMPLETE 4+ VIEW

[c-spine lat]
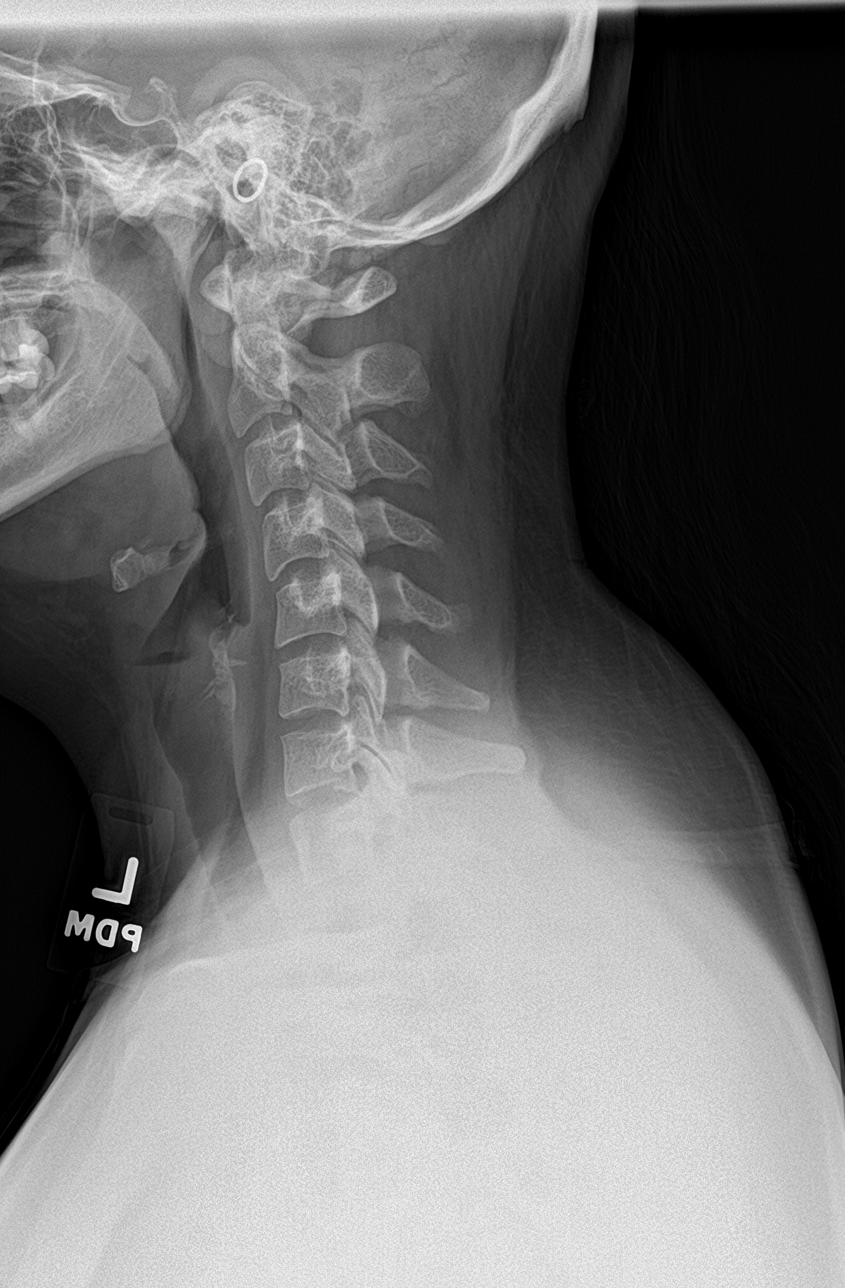

[c-spine obl (1 of 2)]
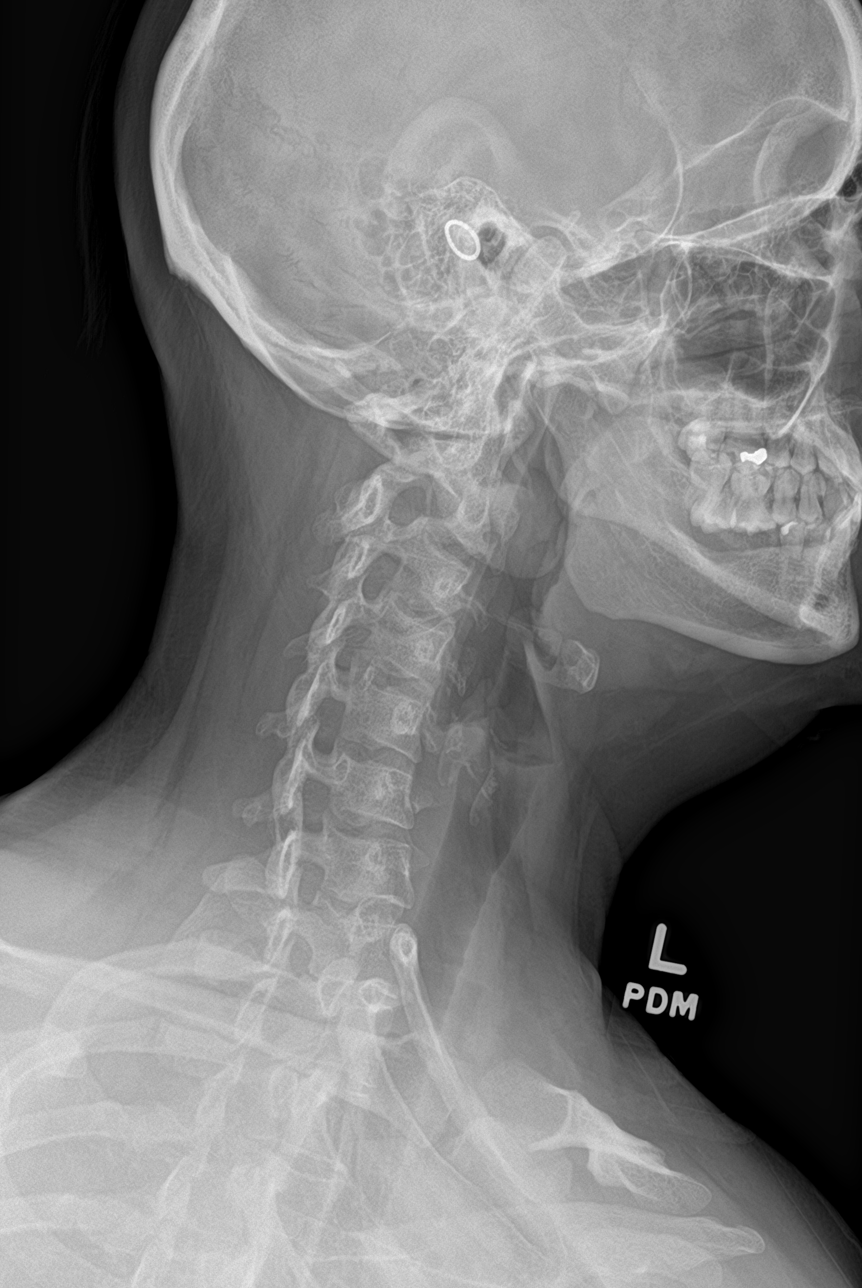

[c-spine obl (2 of 2)]
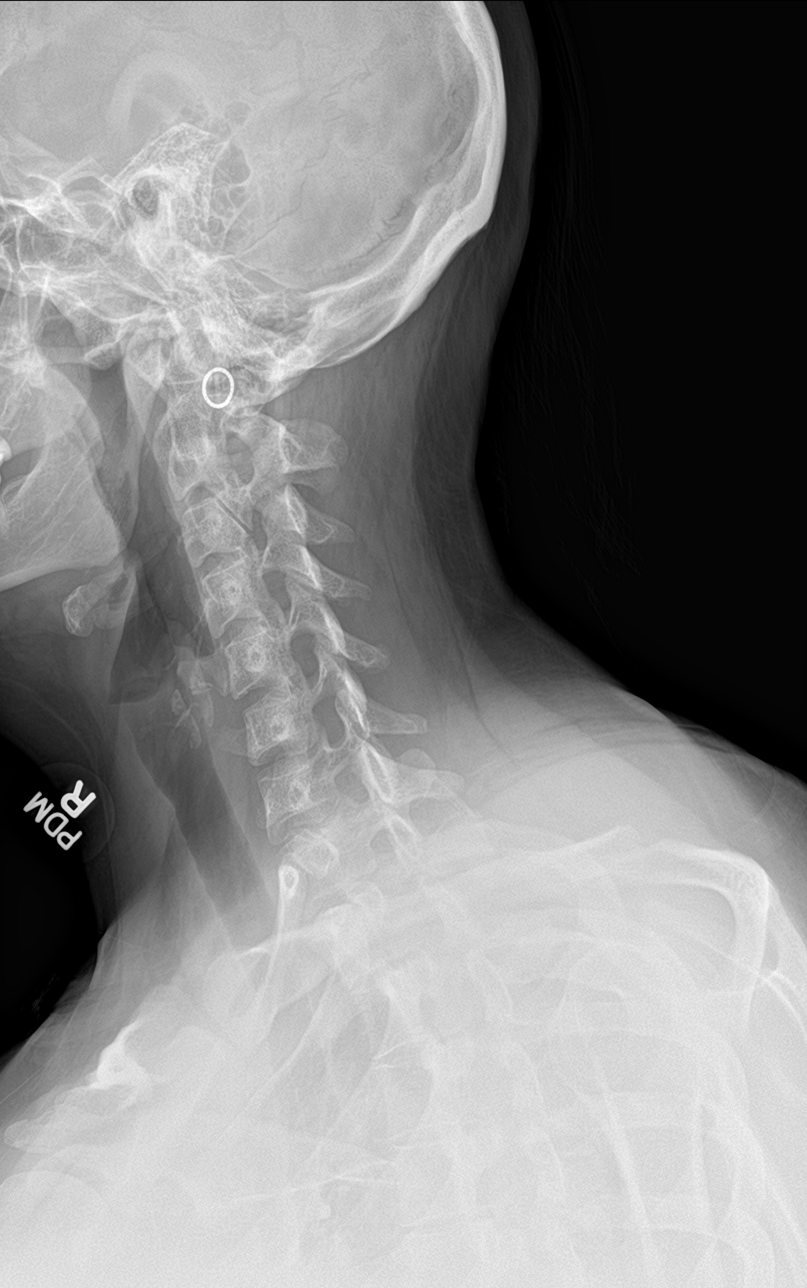

[c-spine ap]
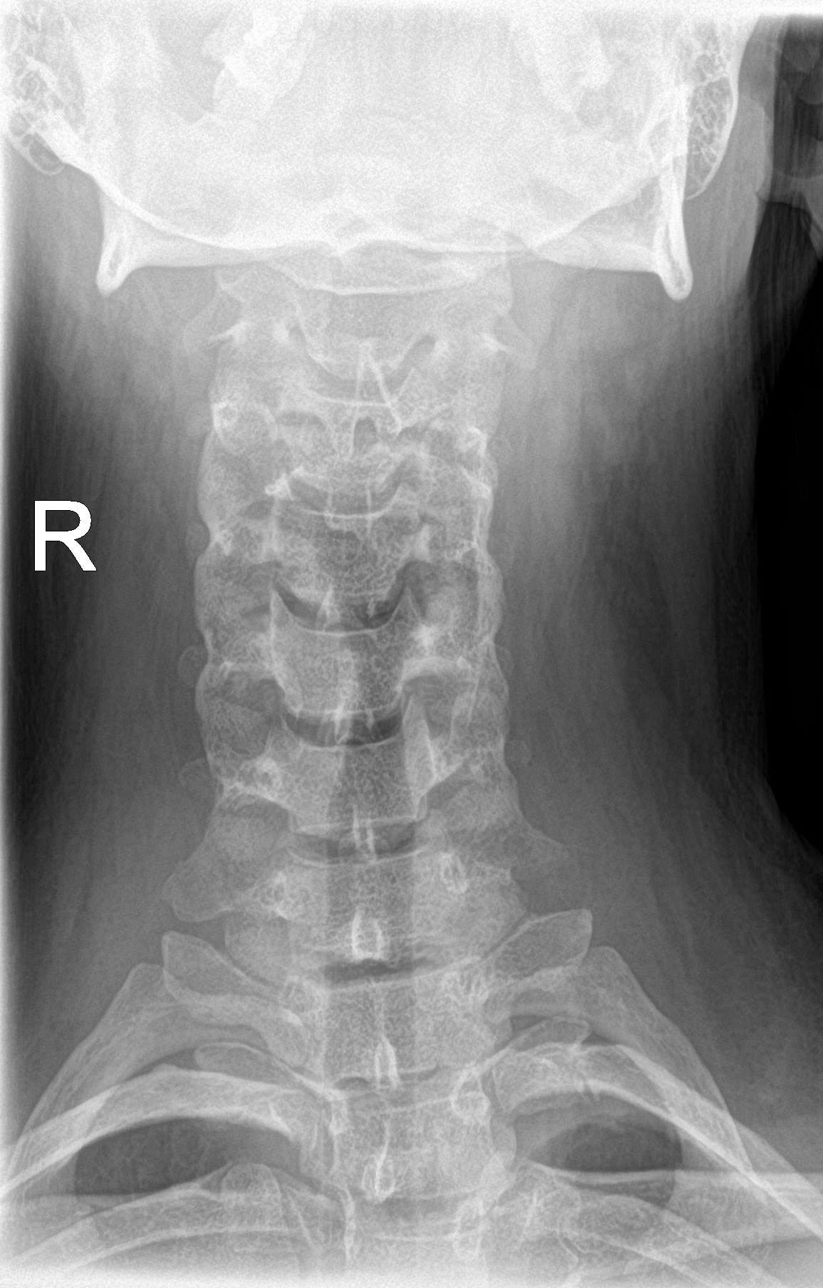

[c-spine open mouth]
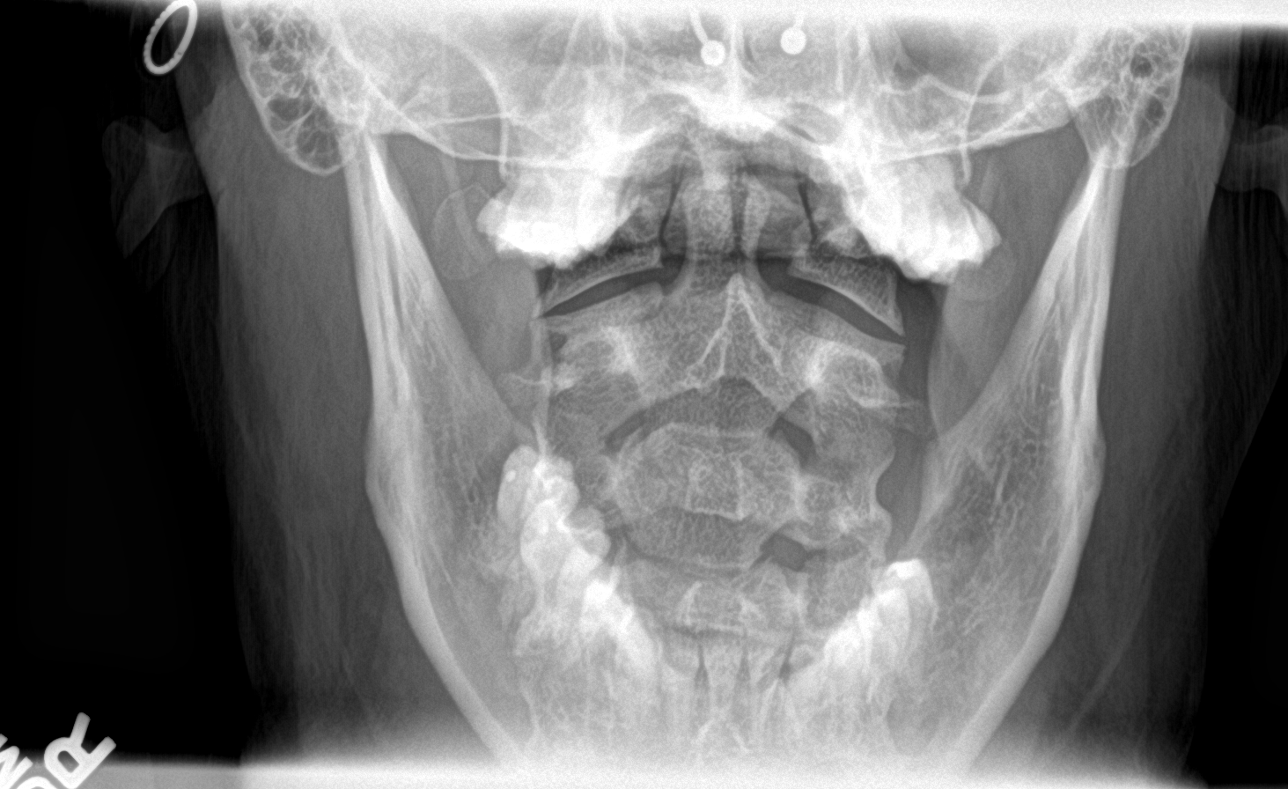

[5 of 5 positions shown; findings below may reference images not displayed]

FINDINGS: There is mild cervical kyphosis which may be positional in nature.
Vertebral body height and intervertebral disc heights are preserved.
There is no fracture or traumatic listhesis of the cervical spine.
Spinal canal is widely patent. The prevertebral soft tissues are
unremarkable. Oblique views demonstrate no significant neural
foraminal narrowing bilaterally. No significant uncovertebral or
facet arthrosis.
IMPRESSION: Negative cervical spine radiographs.

## 2022-08-11 ENCOUNTER — Encounter (HOSPITAL_BASED_OUTPATIENT_CLINIC_OR_DEPARTMENT_OTHER): Payer: Self-pay | Admitting: *Deleted

## 2022-08-11 ENCOUNTER — Other Ambulatory Visit: Payer: Self-pay

## 2022-08-11 ENCOUNTER — Emergency Department (HOSPITAL_BASED_OUTPATIENT_CLINIC_OR_DEPARTMENT_OTHER): Payer: No Typology Code available for payment source | Admitting: Radiology

## 2022-08-11 ENCOUNTER — Emergency Department (HOSPITAL_BASED_OUTPATIENT_CLINIC_OR_DEPARTMENT_OTHER)
Admission: EM | Admit: 2022-08-11 | Discharge: 2022-08-11 | Disposition: A | Discharge status: Home / Self Care | Payer: No Typology Code available for payment source | Attending: Emergency Medicine | Admitting: Emergency Medicine

## 2022-08-11 DIAGNOSIS — M25561 Pain in right knee: Secondary | ICD-10-CM | POA: Insufficient documentation

## 2022-08-11 DIAGNOSIS — W109XXA Fall (on) (from) unspecified stairs and steps, initial encounter: Secondary | ICD-10-CM | POA: Diagnosis not present

## 2022-08-11 DIAGNOSIS — W19XXXA Unspecified fall, initial encounter: Secondary | ICD-10-CM

## 2022-08-11 DIAGNOSIS — G8911 Acute pain due to trauma: Secondary | ICD-10-CM | POA: Insufficient documentation

## 2022-08-11 DIAGNOSIS — S92414A Nondisplaced fracture of proximal phalanx of right great toe, initial encounter for closed fracture: Secondary | ICD-10-CM | POA: Insufficient documentation

## 2022-08-11 DIAGNOSIS — M79674 Pain in right toe(s): Secondary | ICD-10-CM | POA: Diagnosis present

## 2022-08-11 NOTE — ED Notes (Signed)
Buddy taped toes with some gauze separator.

## 2022-08-11 NOTE — ED Provider Notes (Signed)
[facility] DEPT Provider Note   CSN: [id number] Arrival date & time: 08/11/22  1638     History  Chief Complaint  Patient presents with   Fall    Sara Mccormick is a 31 y.o. female reports falling down stairs around 330PM today. Reports hitting right foot and knee. No LOC, no head pain.  Reports right big toe pain, and then pain around her knee that has since resolved.  Has not taken any medications for this.  States she does not know how she fell down the stairs when she landed, but denies any arm pain chest pain abdominal pain.      Home Medications Prior to Admission medications   Medication Sig Start Date End Date Taking? Authorizing Provider  fluocinolone (SYNALAR) 0.01 % external solution Apply topically 2 (two) times daily. 05/11/20   Brunetta Jeans, PA-C  Prenatal Vit-DSS-Fe Fum-FA (PRENATAL 19) tablet Take 1 tablet by mouth daily.    [provider]      Allergies    Norco [hydrocodone-acetaminophen]    Review of Systems   Review of Systems  Musculoskeletal:  Positive for joint swelling. Negative for neck pain.    Physical Exam Updated Vital Signs BP 124/82 (BP Location: Left Arm)   Pulse 83   Temp 97.7 F (36.5 C) (Oral)   Resp 14   Wt 99.8 kg   LMP 07/15/2022   SpO2 100%   Breastfeeding No   BMI 31.57 kg/m  Physical Exam Vitals and nursing note reviewed.  Constitutional:      General: She is not in acute distress.    Appearance: She is well-developed.  HENT:     Head: Normocephalic and atraumatic.  Eyes:     General:        Right eye: No discharge.        Left eye: No discharge.     Conjunctiva/sclera: Conjunctivae normal.  Pulmonary:     Effort: No respiratory distress.  Musculoskeletal:     Cervical back: Normal range of motion. No tenderness.     Comments: Right Knee: No ttp of knee. An effusion is not present.  Negative anterior and posterior drawer. Negative Mcmurray's. +Patellar stability. Negative  valgus and varus stress test. Extension and flexion intact. No sensory deficits.   Right ankle/foot: TTP of PIP of great toe. No edema noted. Not able to bear weight. Able to plantar flex and dorsiflex ankle. Inversion/eversion intact. Negative Thompson test. No midfoot tor base of 5th metatarsal tenderness to palpation. Capillary refill <2sec. Dorsalis pedis pulse present. No foot drop noted. Sensation intact. Warm to touch.    Skin:    Capillary Refill: Capillary refill takes less than 2 seconds.  Neurological:     Mental Status: She is alert.     Comments: Clear speech.   Psychiatric:        Behavior: Behavior normal.        Thought Content: Thought content normal.     ED Results / Procedures / Treatments   Labs (all labs ordered are listed, but only abnormal results are displayed) Labs Reviewed - No data to display  EKG None  Radiology DG Foot Complete Right  Result Date: 08/11/2022 CLINICAL DATA:  Fall, foot pain EXAM: RIGHT FOOT COMPLETE - 3+ VIEW COMPARISON:  None Available. FINDINGS: Tiny bony fragment along the medial base of the great toe proximal phalanx which is suspicious for a nondisplaced avulsion fracture. No additional fractures. Normal alignment. There is mild joint  space narrowing involving the first MTP joint and throughout the midfoot. No erosion. Soft tissues are unremarkable. IMPRESSION: Tiny bony fragment along the medial base of the great toe proximal phalanx which is suspicious for a nondisplaced avulsion fracture. Correlate with point tenderness. Electronically Signed   By: Duanne Guess D.O.   On: 08/11/2022 18:00   DG Knee Complete 4 Views Right  Result Date: 08/11/2022 CLINICAL DATA:  Fall, pain EXAM: RIGHT KNEE - COMPLETE 4+ VIEW COMPARISON:  None Available. FINDINGS: No evidence of fracture, dislocation, or joint effusion. No evidence of arthropathy or other focal bone abnormality. Soft tissues are unremarkable. IMPRESSION: Negative. Electronically  Signed   By: Duanne Guess D.O.   On: 08/11/2022 17:57    Procedures Procedures   Medications Ordered in ED Medications - No data to display  ED Course/ Medical Decision Making/ A&P                           Medical Decision Making Patient is a 31 year old female, here for fall that occurred at 330 today.  She complained of initial right knee pain, which is since resolved, and right toe pain.  She did not lose consciousness, not on any blood thinners, and no hand, abdomen, chest, other pain noted.  Knee x-ray and foot x-ray ordered by triage RN.  Amount and/or Complexity of Data Reviewed Radiology: ordered.    Details: Shows an avulsion fracture of the great proximal toe. Discussion of management or test interpretation with external provider(s): Patient found to have an avulsion fracture of the great proximal toe, will place in hard sole shoe, buddy tape.  No lacerations noted, thus no tetanus necessary.  Discussed treatment with Tylenol and ibuprofen for pain control, and follow-up with PCP.  Patient voiced understanding.   Final Clinical Impression(s) / ED Diagnoses Final diagnoses:  Closed nondisplaced fracture of proximal phalanx of right great toe, initial encounter  Fall, initial encounter  Acute pain of right knee    Rx / DC Orders ED Discharge Orders     None         Tejay Hubert, [place], PA 08/11/22 2055    Loetta Rough, MD 08/12/22 0002

## 2022-08-11 NOTE — Discharge Instructions (Addendum)
Keep your toe buddy taped, and wear hard sole shoe for support.  Please follow-up with your primary care doctor for this.  Take Tylenol and ibuprofen for pain.  If you start having severe nausea, vomiting, headache please return to the ER.  If you have any changes to this color of your skin including pallor, or develop numbness in your foot please return to the ER.

## 2022-08-11 NOTE — ED Triage Notes (Signed)
Pt lost her footing and tripped down 3 steps of stairs and his right knee and right great toe was bent back.  No LOC.

## 2022-08-11 NOTE — ED Notes (Signed)
Please note VS were taken at triage and were WNL, did not cross over to chart
